# Patient Record
Sex: Male | Born: 1965 | Race: White | Hispanic: No | Marital: Married | Smoking: Former smoker
Health system: Southern US, Community
[De-identification: ages and names within clinical notes are randomized; demographics above are authoritative.]

## PROBLEM LIST (undated history)

## (undated) DIAGNOSIS — F329 Major depressive disorder, single episode, unspecified: Secondary | ICD-10-CM

## (undated) DIAGNOSIS — E7800 Pure hypercholesterolemia, unspecified: Secondary | ICD-10-CM

## (undated) DIAGNOSIS — K219 Gastro-esophageal reflux disease without esophagitis: Secondary | ICD-10-CM

## (undated) DIAGNOSIS — I10 Essential (primary) hypertension: Secondary | ICD-10-CM

## (undated) DIAGNOSIS — F32A Depression, unspecified: Secondary | ICD-10-CM

## (undated) DIAGNOSIS — E785 Hyperlipidemia, unspecified: Secondary | ICD-10-CM

## (undated) DIAGNOSIS — F419 Anxiety disorder, unspecified: Secondary | ICD-10-CM

## (undated) HISTORY — PX: EYE SURGERY: SHX253

## (undated) HISTORY — PX: HERNIA REPAIR: SHX51

---

## 2003-09-02 ENCOUNTER — Inpatient Hospital Stay (HOSPITAL_COMMUNITY): Admission: EM | Admit: 2003-09-02 | Discharge: 2003-09-14 | Payer: Self-pay | Admitting: Psychiatry

## 2003-10-08 ENCOUNTER — Emergency Department (HOSPITAL_COMMUNITY): Admission: EM | Admit: 2003-10-08 | Discharge: 2003-10-08 | Payer: Self-pay | Admitting: Emergency Medicine

## 2006-04-22 ENCOUNTER — Encounter: Admission: RE | Admit: 2006-04-22 | Discharge: 2006-07-21 | Payer: Self-pay | Admitting: Family Medicine

## 2006-09-20 ENCOUNTER — Emergency Department (HOSPITAL_COMMUNITY): Admission: EM | Admit: 2006-09-20 | Discharge: 2006-09-20 | Payer: Self-pay | Admitting: Emergency Medicine

## 2006-10-17 ENCOUNTER — Ambulatory Visit (HOSPITAL_COMMUNITY): Admission: RE | Admit: 2006-10-17 | Discharge: 2006-10-17 | Payer: Self-pay | Admitting: Otolaryngology

## 2006-10-22 ENCOUNTER — Ambulatory Visit (HOSPITAL_COMMUNITY): Admission: RE | Admit: 2006-10-22 | Discharge: 2006-10-23 | Payer: Self-pay | Admitting: Otolaryngology

## 2009-01-02 ENCOUNTER — Emergency Department (HOSPITAL_COMMUNITY): Admission: EM | Admit: 2009-01-02 | Discharge: 2009-01-03 | Payer: Self-pay | Admitting: Emergency Medicine

## 2009-01-03 ENCOUNTER — Inpatient Hospital Stay (HOSPITAL_COMMUNITY): Admission: RE | Admit: 2009-01-03 | Discharge: 2009-01-09 | Payer: Self-pay | Admitting: *Deleted

## 2009-01-03 ENCOUNTER — Ambulatory Visit: Payer: Self-pay | Admitting: *Deleted

## 2009-01-06 ENCOUNTER — Emergency Department (HOSPITAL_COMMUNITY): Admission: EM | Admit: 2009-01-06 | Discharge: 2009-01-07 | Payer: Self-pay | Admitting: Emergency Medicine

## 2010-12-26 LAB — URINALYSIS, ROUTINE W REFLEX MICROSCOPIC
Glucose, UA: NEGATIVE mg/dL
Ketones, ur: NEGATIVE mg/dL
Nitrite: NEGATIVE
Protein, ur: NEGATIVE mg/dL
Urobilinogen, UA: 1 mg/dL (ref 0.0–1.0)

## 2010-12-26 LAB — LIPASE, BLOOD: Lipase: 22 U/L (ref 11–59)

## 2010-12-26 LAB — CBC
HCT: 43.9 % (ref 39.0–52.0)
HCT: 47.4 % (ref 39.0–52.0)
Hemoglobin: 16.3 g/dL (ref 13.0–17.0)
MCHC: 34.3 g/dL (ref 30.0–36.0)
MCHC: 34.3 g/dL (ref 30.0–36.0)
MCV: 85.9 fL (ref 78.0–100.0)
Platelets: 251 10*3/uL (ref 150–400)
RBC: 5.44 MIL/uL (ref 4.22–5.81)
RDW: 11.9 % (ref 11.5–15.5)

## 2010-12-26 LAB — COMPREHENSIVE METABOLIC PANEL
ALT: 76 U/L — ABNORMAL HIGH (ref 0–53)
Alkaline Phosphatase: 73 U/L (ref 39–117)
CO2: 21 mEq/L (ref 19–32)
Calcium: 10 mg/dL (ref 8.4–10.5)
GFR calc non Af Amer: >60 mL/min (ref >60)
Glucose, Bld: 114 mg/dL — ABNORMAL HIGH (ref 70–99)
Potassium: 4 mEq/L (ref 3.5–5.1)
Sodium: 134 mEq/L — ABNORMAL LOW (ref 135–145)

## 2010-12-26 LAB — RAPID URINE DRUG SCREEN, HOSP PERFORMED: Benzodiazepines: NOT DETECTED

## 2010-12-26 LAB — POCT I-STAT, CHEM 8
Chloride: 107 mEq/L (ref 96–112)
Creatinine, Ser: 1.1 mg/dL (ref 0.4–1.5)
Hemoglobin: 15 g/dL (ref 13.0–17.0)
Potassium: 3.9 mEq/L (ref 3.5–5.1)
Sodium: 139 mEq/L (ref 135–145)

## 2010-12-26 LAB — DIFFERENTIAL
Basophils Absolute: 0.1 10*3/uL (ref 0.0–0.1)
Basophils Relative: 0 % (ref 0–1)
Basophils Relative: 1 % (ref 0–1)
Eosinophils Absolute: 0.1 10*3/uL (ref 0.0–0.7)
Eosinophils Absolute: 0.1 10*3/uL (ref 0.0–0.7)
Eosinophils Relative: 2 % (ref 0–5)
Neutrophils Relative %: 64 % (ref 43–77)
Neutrophils Relative %: 77 % (ref 43–77)

## 2011-01-29 NOTE — Discharge Summary (Signed)
Steven Blake, Steven Blake NO.:  [id number]   MEDICAL RECORD NO.:  [id number]          PATIENT TYPE:  IPS   LOCATION:  0305                          FACILITY:  [facility]   PHYSICIAN:  Jasmine Pang, M.D. DATE OF BIRTH:  [birth year]   DATE OF ADMISSION:  01/03/2009  DATE OF DISCHARGE:  01/09/2009                               DISCHARGE SUMMARY   IDENTIFICATION:  This is a 45 year old single white male from  [place], [place] who was admitted on a voluntary basis on  January 03, 2009.   HISTORY OF PRESENT ILLNESS:  The patient was suicidal with no plan.  He  has a history of suicide attempt by overdose.  He states he has been  depressed for a long time.  He could not take it when his girlfriend  put him out.  He states he has been hearing vague voices earlier, but  stated that this has not been bad.  For further admission information,  see psychiatric admission assessment.   PHYSICAL FINDINGS:  Physical exam was done in the [facility] ED prior  to admission.  There were no acute physical or medical problems noted.   ADMISSION LABORATORIES:  Alcohol level was less than 5.  BMET was within  normal limits.  Hemoglobin was 15 and hematocrit 44.  CBC was within  normal limits.  Urinalysis was negative.  UDS was negative.   HOSPITAL COURSE:  Upon admission, the patient was restarted on Seroquel  300 mg p.o. q.h.s.  He was also placed on Protonix 40 mg a day, Prozac  20 mg daily.  He was also started on Motrin 600 mg q.6 h. p.r.n. pain  and Imodium for diarrhea and simethicone for gas and Bentyl 10 mg q.i.d.  p.r.n. abdominal cramping due to GI distress.  In individual sessions  with me, the patient stated he has no means of transportation to home.  He states he became suicidal.  He also complains of weird social  anxiety.  He also has chronic pain on the right side of his face from  some eyelashes going backward.  He states he first go to the [facility], but he  is not compliant recently.  He was in [facility] several months ago on probation for possession of  paraphernalia.  The patient was focused on wanting to go to the [facility] when he left here.  He continued to be depressed, anxious, and  hopeless as hospitalization progressed.  There was positive suicidal  ideation.  He discussed having no financial means whatsoever.  His sleep  was good and appetite was good.  On January 06, 2009, mood was less  depressed and less anxious.  He discussed wanting to get straight now  for his children.  He continued to the plan to go to the [facility]  post discharge.  He was continuing to complain of severe abdominal pain.  He was sent to [facility] ED for evaluation revealed that he  was suffering from severe gas pain.  He was started on  Donnatal p.o. q.2  hours p.r.n.  On January 07, 2009, he was continuing to feel less  depressed, less anxious.  He stated he wanted to call his children's  mother, but was not sure how she will reacts since they have been having  difficulties.  As hospitalization progressed, sleep was good.  He  anticipated discharge date on January 09, 2009.  On January 09, 2009, his  sleep was good.  Appetite was fair.  Mood was less depressed and less  anxious.  Affect was consistent with mood.  There was no suicidal or  homicidal ideation.  No thoughts of self-injurious behavior.  No  auditory or visual hallucinations.  No paranoia or delusions.  Thoughts  were logical and goal-directed.  Thought content, no predominant theme.  Cognitive was grossly intact.  Insight good.  Judgment good.  Impulse  control good.  He was felt to be safe for discharge today who planned to  go to the [facility] for further evaluation of his medical problems.   DISCHARGE DIAGNOSES:  Axis I:  Mood disorder, not otherwise specified.  Axis II:  None.  Axis III:  Chronic back pain, osteoarthritis.  Axis IV:  Severe  (problems with primary support group, occupational  problem, housing problem, medical problems).  Axis V:  Global assessment of functioning was 50 upon discharge.  GAF  was 35 upon admission.  GAF highest past year was 60-65.   DISCHARGE PLAN:  There was no specific activity level or dietary  restrictions.   POSTHOSPITAL CARE PLANS:  The patient will go to the [facility] on January 10, 2009 at 9:30 p.m.   DISCHARGE MEDICATIONS:  1. Prozac 20 mg same.  2. Protonix 40 mg in the a.m.  3. Seroquel XR 300 mg at bedtime.      Jasmine Pang, M.D.  Electronically Signed     BHS/MEDQ  D:  01/09/2009  T:  01/10/2009  Job:  [id number]

## 2011-02-01 NOTE — Op Note (Signed)
NAMETAESHAWN, HELFMAN               ACCOUNT NO.:  [id number]   MEDICAL RECORD NO.:  [id number]          PATIENT TYPE:  OIB   LOCATION:  5743                         FACILITY:  [facility]   PHYSICIAN:  Lucky Cowboy, MD         DATE OF BIRTH:  [birth year]   DATE OF PROCEDURE:  10/22/2006  DATE OF DISCHARGE:  10/23/2006                               OPERATIVE REPORT   PREOPERATIVE DIAGNOSIS:  Orbital floor fracture.   POSTOPERATIVE DIAGNOSIS:  Orbital floor fracture.   PROCEDURE:  Open reduction and internal fixation, right orbital floor  fracture.   SURGEON:  Lucky Cowboy, M.D.   ASSISTANT SURGEON:  Karren Burly D. Jenne Pane, M.D.   ANESTHESIA:  General endotracheal anesthesia.   ESTIMATED BLOOD LOSS:  Less than 30 mL.   SPECIMEN:  None.   COMPLICATIONS:  None.   INDICATIONS:  The patient is a 45 year old male who sustained a right  orbital floor fracture.  This has been demonstrated with prolapse of fat  into the right maxillary sinus.  Due to the severity of the fracture as  well as entrapment, open reduction and internal fixation with possible  Caldwell-Luc exposure is planned.   FINDINGS:  The patient was noted to have a very large floor defect on  the right.  This was associated with orbital fat prolapsing into the  maxillary sinus.   DESCRIPTION OF PROCEDURE:  The patient was taken to the operating room  and placed on the table in the supine position.  He was then placed  under general endotracheal anesthesia and the table rotated counter  clockwise 90 degrees.  The eye was prepped with Betadine and draped in  the usual sterile fashion.  1% lidocaine with 1:100,000 was then used to  inject the right lateral canthus as well as the right inferior eyelid.  After allowing time for vasoconstrictive effect, a #15 blade was used to  incise the right lateral canthus.  This was performed and the incision  carried laterally for approximately 6 mm.  This incision was made  through the overlying  skin and underlying muscle and fat was divided  using Bovie cautery.  The lateral canthal ligament was then identified  and cut using scissors.   At this point, the subciliary mucosa was then dissected free from the  underlying orbital fat.  The mucosa was then divided carefully to within  about 5-6 mm of the inferior puncta.  Care was then used with the [facility]  elevator to elevate the periorbita off of the underlying bone and the  fracture was exposed.  Great care was used to dissect the orbital fat  out of the maxillary sinus.  It was next to impossible to free the past  from the inferior orbital nerve.  The nerve appeared to be exquisitely  contused.  A solvable mesh plate with 7 x 7 holes measuring 1.5 mm in  thickness was then placed after contouring it in a cone shaped fashion  to fit the contour of the orbital floor.  The mucosa was then placed  back into its original position.  The lateral tendon was reapproximated to the periosteum in a simple  interrupted fashion which was buried using 4-0 Monocryl.  The  subcutaneous tissues were reapproximated in a simple interrupted buried  stitch using 4-0 Vicryl.  The skin was closed in a simple interrupted  stitch using 6-0 Prolene.  Erythromycin ointment was applied.  The table  was rotated clockwise 90 degrees to its original position.  The patient  was awakened from anesthesia and taken to the post anesthesia care unit  in stable condition.  There were no complications.      Lucky Cowboy, MD  Electronically Signed     SJ/MEDQ  D:  01/22/2007  T:  01/22/2007  Job:  [id number]
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
## 2014-09-03 ENCOUNTER — Emergency Department (HOSPITAL_COMMUNITY)
Admission: EM | Admit: 2014-09-03 | Discharge: 2014-09-03 | Disposition: A | Discharge status: Home / Self Care | Payer: Non-veteran care | Attending: Emergency Medicine | Admitting: Emergency Medicine

## 2014-09-03 ENCOUNTER — Emergency Department (HOSPITAL_COMMUNITY): Payer: Non-veteran care

## 2014-09-03 ENCOUNTER — Encounter (HOSPITAL_COMMUNITY): Payer: Self-pay | Admitting: Emergency Medicine

## 2014-09-03 DIAGNOSIS — I10 Essential (primary) hypertension: Secondary | ICD-10-CM | POA: Insufficient documentation

## 2014-09-03 DIAGNOSIS — Z87891 Personal history of nicotine dependence: Secondary | ICD-10-CM | POA: Diagnosis not present

## 2014-09-03 DIAGNOSIS — Z8639 Personal history of other endocrine, nutritional and metabolic disease: Secondary | ICD-10-CM | POA: Diagnosis not present

## 2014-09-03 DIAGNOSIS — F419 Anxiety disorder, unspecified: Secondary | ICD-10-CM | POA: Diagnosis not present

## 2014-09-03 DIAGNOSIS — R079 Chest pain, unspecified: Secondary | ICD-10-CM | POA: Diagnosis not present

## 2014-09-03 DIAGNOSIS — Z8719 Personal history of other diseases of the digestive system: Secondary | ICD-10-CM | POA: Insufficient documentation

## 2014-09-03 HISTORY — DX: Depression, unspecified: F32.A

## 2014-09-03 HISTORY — DX: Major depressive disorder, single episode, unspecified: F32.9

## 2014-09-03 HISTORY — DX: Gastro-esophageal reflux disease without esophagitis: K21.9

## 2014-09-03 HISTORY — DX: Pure hypercholesterolemia, unspecified: E78.00

## 2014-09-03 HISTORY — DX: Hyperlipidemia, unspecified: E78.5

## 2014-09-03 HISTORY — DX: Anxiety disorder, unspecified: F41.9

## 2014-09-03 HISTORY — DX: Essential (primary) hypertension: I10

## 2014-09-03 LAB — CBC
HEMATOCRIT: 41.1 % (ref 39.0–52.0)
HEMOGLOBIN: 14.1 g/dL (ref 13.0–17.0)
MCH: 28.7 pg (ref 26.0–34.0)
MCHC: 34.3 g/dL (ref 30.0–36.0)
MCV: 83.5 fL (ref 78.0–100.0)
Platelets: 261 10*3/uL (ref 150–400)
RBC: 4.92 MIL/uL (ref 4.22–5.81)
RDW: 13.1 % (ref 11.5–15.5)
WBC: 9 10*3/uL (ref 4.0–10.5)

## 2014-09-03 LAB — BASIC METABOLIC PANEL
Anion gap: 12 (ref 5–15)
BUN: 13 mg/dL (ref 6–23)
CALCIUM: 9.6 mg/dL (ref 8.4–10.5)
CO2: 24 meq/L (ref 19–32)
Chloride: 104 mEq/L (ref 96–112)
Creatinine, Ser: 0.89 mg/dL (ref 0.50–1.35)
GFR calc Af Amer: >90 mL/min (ref >90)
GLUCOSE: 98 mg/dL (ref 70–99)
Potassium: 4.3 mEq/L (ref 3.7–5.3)
SODIUM: 140 meq/L (ref 137–147)

## 2014-09-03 LAB — I-STAT TROPONIN, ED: TROPONIN I, POC: 0 ng/mL (ref 0.00–0.08)

## 2014-09-03 NOTE — ED Notes (Signed)
Pt comfortable with discharge and follow up instructions. Pt declines wheelchair, escorted to waiting area by this RN. No prescriptions. 

## 2014-09-03 NOTE — ED Provider Notes (Signed)
CSN: [id number]     Arrival date & time 09/03/14  6160 History   First MD Initiated Contact with Patient 09/03/14 [phone]     Chief Complaint  Patient presents with  . Chest Pain     (Consider location/radiation/quality/duration/timing/severity/associated sxs/prior Treatment) HPI Comments: Patient is a 48 year old male with a past medical history of GERD, hypertension, hyperlipidemia, and depression/anxiety who presents with a 1 week history of chest pain. Patient reports the chest pain has been intermittent without known trigger. The pain will start and stop spontaneously. The pain "is different every time. Patient reports sometimes the pain has been sharp, dull, pins and needles, and aching. Sometimes the pain will be in his left chest and radiate to the left shoulder and other times the pain will be in the right chest and radiate to his right shoulder. He denies any associated symptoms. He does report a history of anxiety which he thinks may be causing his chest pain. Patient saw his PCP one week ago and was instructed to come to the ED because he was not able to get back to the [place] for another appointment. No aggravating/alleviating factors. Last episode occurred prior to arrival.   Past Medical History  Diagnosis Date  . GERD (gastroesophageal reflux disease)   . Hypertension   . Hyperlipidemia   . Depression   . Anxiety   . Hypercholesteremia    Past Surgical History  Procedure Laterality Date  . Hernia repair    . Eye surgery     No family history on file. History  Substance Use Topics  . Smoking status: Former Research scientist (life sciences)  . Smokeless tobacco: Not on file  . Alcohol Use: No    Review of Systems  Constitutional: Negative for fever, chills and fatigue.  HENT: Negative for trouble swallowing.   Eyes: Negative for visual disturbance.  Respiratory: Negative for shortness of breath.   Cardiovascular: Positive for chest pain. Negative for palpitations.  Gastrointestinal: Negative  for nausea, vomiting, abdominal pain and diarrhea.  Genitourinary: Negative for dysuria and difficulty urinating.  Musculoskeletal: Negative for arthralgias and neck pain.  Skin: Negative for color change.  Neurological: Negative for dizziness and weakness.  Psychiatric/Behavioral: Negative for dysphoric mood. The patient is nervous/anxious.       Allergies  Sulfa antibiotics  Home Medications   Prior to Admission medications   Not on File   BP 103/60 mmHg  Pulse 58  Temp(Src) 98.4 F (36.9 C) (Oral)  Resp 16  Ht 5\' 6"  (1.676 m)  Wt 200 lb (90.719 kg)  BMI 32.30 kg/m2  SpO2 100% Physical Exam  Constitutional: He is oriented to person, place, and time. He appears well-developed and well-nourished. No distress.  HENT:  Head: Normocephalic and atraumatic.  Eyes: Conjunctivae and EOM are normal.  Neck: Normal range of motion.  Cardiovascular: Normal rate and regular rhythm.  Exam reveals no gallop and no friction rub.   No murmur heard. Pulmonary/Chest: Effort normal and breath sounds normal. He has no wheezes. He has no rales. He exhibits no tenderness.  Abdominal: Soft. He exhibits no distension. There is no tenderness. There is no rebound.  Musculoskeletal: Normal range of motion.  No lower extremity edema or calf tenderness to palpation.   Neurological: He is alert and oriented to person, place, and time. Coordination normal.  Speech is goal-oriented. Moves limbs without ataxia.   Skin: Skin is warm and dry.  Psychiatric: He has a normal mood and affect. His behavior is normal.  Nursing note and vitals reviewed.   ED Course  Procedures (including critical care time) Labs Review Labs Reviewed  [place], ED    Imaging Review Dg Chest 2 View  09/03/2014   CLINICAL DATA:  Two week history of chest pain. Difficulty breathing  EXAM: CHEST  2 VIEW  COMPARISON:  October 17, 2006  FINDINGS: Lungs are clear. Heart size pulmonary  vascularity are normal. No adenopathy. No pneumothorax. No bone lesions.  IMPRESSION: No edema or consolidation.   Electronically Signed   By: Lowella Grip M.D.   On: 09/03/2014 07:26     EKG Interpretation   Date/Time:  Saturday September 03 2014 06:22:33 EST Ventricular Rate:  55 PR Interval:  163 QRS Duration: 96 QT Interval:  404 QTC Calculation: 386 R Axis:   14 Text Interpretation:  Sinus bradycardia Since last tracing rate slower (17 Oct 2006) Confirmed by [facility]  MD-I, IVA ([id number]) on 09/03/2014 6:45:06 AM      MDM   Final diagnoses:  Chest pain    6:53 AM Labs, troponin, and chest xray pending. Vitals stable and patient afebrile.   8:13 AM Labs, chest xray, and troponin unremarkable for acute changes. Patient's HEART score is 3, making him low risk for major cardiac event. Patient's pain does not sound cardiac in nature. Patient will have delta troponin. If negative, patient will be discharged with outpatient follow up.   9:31 AM Spoke with Dr Audie Pinto about the patient. Delta troponin will not offer anymore information. Patient will be discharged without further workup. Patient instructed to return to the ED with worsening or concerning symptoms.   Alvina Chou, PA-C 09/03/14 1308  Dot Lanes, MD 09/12/14 [phone]

## 2014-09-03 NOTE — ED Notes (Signed)
Pt arrives from home with c/o chest pain that has been going on for a bout a week of two, was scheduled to see primary in [place] yesterday but unable to get a ride, advised to come here to get checked out. Per pt, pain moves from central chest to left, from shoulder and into left underarm. Dull intermittently, occasionally sharp.

## 2014-09-03 NOTE — Discharge Instructions (Signed)
Follow up with your doctor for further evaluation. Return to the ED with worsening or concerning symptoms. Refer to attached documents for more information.

## 2015-11-19 ENCOUNTER — Encounter (HOSPITAL_COMMUNITY): Payer: Self-pay | Admitting: *Deleted

## 2015-11-19 ENCOUNTER — Emergency Department (HOSPITAL_COMMUNITY)
Admission: EM | Admit: 2015-11-19 | Discharge: 2015-11-19 | Disposition: A | Discharge status: Home / Self Care | Payer: Non-veteran care | Attending: Emergency Medicine | Admitting: Emergency Medicine

## 2015-11-19 ENCOUNTER — Emergency Department (HOSPITAL_COMMUNITY): Payer: Non-veteran care

## 2015-11-19 DIAGNOSIS — Z87891 Personal history of nicotine dependence: Secondary | ICD-10-CM | POA: Insufficient documentation

## 2015-11-19 DIAGNOSIS — Z79899 Other long term (current) drug therapy: Secondary | ICD-10-CM | POA: Diagnosis not present

## 2015-11-19 DIAGNOSIS — X58XXXA Exposure to other specified factors, initial encounter: Secondary | ICD-10-CM | POA: Insufficient documentation

## 2015-11-19 DIAGNOSIS — Y998 Other external cause status: Secondary | ICD-10-CM | POA: Insufficient documentation

## 2015-11-19 DIAGNOSIS — E785 Hyperlipidemia, unspecified: Secondary | ICD-10-CM | POA: Diagnosis not present

## 2015-11-19 DIAGNOSIS — S29012A Strain of muscle and tendon of back wall of thorax, initial encounter: Secondary | ICD-10-CM | POA: Insufficient documentation

## 2015-11-19 DIAGNOSIS — E7800 Pure hypercholesterolemia, unspecified: Secondary | ICD-10-CM | POA: Insufficient documentation

## 2015-11-19 DIAGNOSIS — I10 Essential (primary) hypertension: Secondary | ICD-10-CM | POA: Insufficient documentation

## 2015-11-19 DIAGNOSIS — Y9389 Activity, other specified: Secondary | ICD-10-CM | POA: Diagnosis not present

## 2015-11-19 DIAGNOSIS — F419 Anxiety disorder, unspecified: Secondary | ICD-10-CM | POA: Insufficient documentation

## 2015-11-19 DIAGNOSIS — F329 Major depressive disorder, single episode, unspecified: Secondary | ICD-10-CM | POA: Insufficient documentation

## 2015-11-19 DIAGNOSIS — Y9289 Other specified places as the place of occurrence of the external cause: Secondary | ICD-10-CM | POA: Diagnosis not present

## 2015-11-19 DIAGNOSIS — M546 Pain in thoracic spine: Secondary | ICD-10-CM | POA: Diagnosis present

## 2015-11-19 DIAGNOSIS — S29019A Strain of muscle and tendon of unspecified wall of thorax, initial encounter: Secondary | ICD-10-CM

## 2015-11-19 DIAGNOSIS — K219 Gastro-esophageal reflux disease without esophagitis: Secondary | ICD-10-CM | POA: Insufficient documentation

## 2015-11-19 MED ORDER — TRAMADOL HCL 50 MG PO TABS: 50.0000 mg | ORAL_TABLET | Freq: Four times a day (QID) | ORAL | Status: DC | PRN

## 2015-11-19 MED ORDER — KETOROLAC TROMETHAMINE 60 MG/2ML IM SOLN
60.0000 mg | Freq: Once | INTRAMUSCULAR | Status: AC
  Administered 2015-11-19: 60 mg via INTRAMUSCULAR
  Filled 2015-11-19: qty 2

## 2015-11-19 MED ORDER — CYCLOBENZAPRINE HCL 10 MG PO TABS: 10.0000 mg | ORAL_TABLET | Freq: Three times a day (TID) | ORAL | Status: DC | PRN

## 2015-11-19 MED ORDER — PREDNISONE 50 MG PO TABS: 50.0000 mg | ORAL_TABLET | Freq: Every day | ORAL | Status: DC

## 2015-11-19 NOTE — ED Notes (Signed)
Pt reports backj pain started this AM with out injury.

## 2015-11-19 NOTE — Discharge Instructions (Signed)
Return here as needed and most likely strained her thoracic musculature.  Follow-up with her primary care doctor.  Use ice and heat on the area that is sore

## 2015-11-19 NOTE — ED Notes (Signed)
Declined W/C at D/C and was escorted to lobby by RN. 

## 2015-11-19 NOTE — ED Provider Notes (Signed)
CSN: [place]:[id number]     Arrival date & time 11/19/15  1129 History  By signing my name below, I, Steven Blake, attest that this documentation has been prepared under the direction and in the presence of [employer], PA-C. Electronically Signed: Julien Blake, ED Scribe. 11/19/2015. 11:45 AM.    Chief Complaint  Patient presents with  . Back Pain       The history is provided by the patient. No language interpreter was used.   HPI Comments: Steven Blake is a 50 y.o. male who has a hx of GERD, HTN, hyperlipidemia, depression, anxiety, and hypercholesteremia presents to the Emergency Department complaining of constant, gradual worsening, aching thoracic back pain that radiates to his right arm onset this morning. He endorses increased pain when taking a deep breath. He has a hx of intermittent lower back pain in the past but reports this pain is completely different from the others. Pt has not taken any medication to alleviate his pain. He denies injury or fall.  Past Medical History  Diagnosis Date  . GERD (gastroesophageal reflux disease)   . Hypertension   . Hyperlipidemia   . Depression   . Anxiety   . Hypercholesteremia    Past Surgical History  Procedure Laterality Date  . Hernia repair    . Eye surgery     No family history on file. Social History  Substance Use Topics  . Smoking status: Former Research scientist (life sciences)  . Smokeless tobacco: Not on file  . Alcohol Use: No    Review of Systems  A complete 10 system review of systems was obtained and all systems are negative except as noted in the HPI and PMH.    Allergies  Niacin and related and Sulfa antibiotics  Home Medications   Prior to Admission medications   Medication Sig Start Date End Date Taking? Authorizing Provider  atorvastatin (LIPITOR) 40 MG tablet Take 40 mg by mouth daily.    Historical Provider, MD  gabapentin (NEURONTIN) 300 MG capsule Take 300 mg by mouth 3 (three) times daily.    Historical Provider, MD   hydrochlorothiazide (HYDRODIURIL) 25 MG tablet Take 25 mg by mouth daily.    Historical Provider, MD  metoprolol (LOPRESSOR) 50 MG tablet Take 50 mg by mouth 2 (two) times daily.    Historical Provider, MD  omeprazole (PRILOSEC) 20 MG capsule Take 20 mg by mouth daily.    Historical Provider, MD   Triage vitals: BP 149/75 mmHg  Pulse 57  Temp(Src) 97.8 F (36.6 C) (Oral)  Resp 16  SpO2 95% Physical Exam  Constitutional: He is oriented to person, place, and time. He appears well-developed and well-nourished.  HENT:  Head: Normocephalic and atraumatic.  Eyes: EOM are normal.  Neck: Normal range of motion.  Cardiovascular: Normal rate, regular rhythm, normal heart sounds and intact distal pulses.   Pulmonary/Chest: Effort normal and breath sounds normal. No respiratory distress.  Abdominal: Soft. He exhibits no distension. There is no tenderness.  Musculoskeletal: Normal range of motion.  Mid right sided thoracic pain medial to scapula  Neurological: He is alert and oriented to person, place, and time.  Skin: Skin is warm and dry.  Psychiatric: He has a normal mood and affect. Judgment normal.  Nursing note and vitals reviewed.   ED Course  Procedures  DIAGNOSTIC STUDIES: Oxygen Saturation is 95% on RA, adequate by my interpretation.  COORDINATION OF CARE:  11:44 AM Will order x-ray of back. Discussed treatment plan with pt at bedside and  pt agreed to plan.  Labs Review Labs Reviewed - No data to display  Imaging Review Dg Chest 2 View  11/19/2015  CLINICAL DATA:  Mid thoracic back pain radiating to RIGHT-side of chest for 2 days worse this morning, dyspnea, hypertension, former smoker, hyperlipidemia EXAM: CHEST  2 VIEW COMPARISON:  09/03/2014 FINDINGS: Upper normal heart size. Mediastinal contours and pulmonary vascularity normal. Lungs clear. No pleural effusion or pneumothorax. Bones unremarkable. IMPRESSION: No acute abnormalities. Electronically Signed   By: Lavonia Dana  M.D.   On: 11/19/2015 12:24   I have personally reviewed and evaluated these images and lab results as part of my medical decision-making.  Patient retreated for thoracic strain.  He has no risk factors for PE.  He is not hypoxic and not tachycardic.  He is in no acute distress  Dalia Heading, PA-C 11/19/15 1234  Quintella Reichert, MD 11/20/15 [phone]

## 2018-06-08 ENCOUNTER — Encounter: Payer: Self-pay | Admitting: Gastroenterology

## 2018-07-14 ENCOUNTER — Encounter: Payer: Self-pay | Admitting: Gastroenterology

## 2018-07-14 ENCOUNTER — Ambulatory Visit (INDEPENDENT_AMBULATORY_CARE_PROVIDER_SITE_OTHER): Payer: No Typology Code available for payment source | Admitting: Gastroenterology

## 2018-07-14 ENCOUNTER — Encounter (INDEPENDENT_AMBULATORY_CARE_PROVIDER_SITE_OTHER): Payer: Self-pay

## 2018-07-14 VITALS — BP 120/86 | HR 60 | Ht 66.0 in | Wt 182.8 lb

## 2018-07-14 DIAGNOSIS — K625 Hemorrhage of anus and rectum: Secondary | ICD-10-CM | POA: Diagnosis not present

## 2018-07-14 DIAGNOSIS — K219 Gastro-esophageal reflux disease without esophagitis: Secondary | ICD-10-CM | POA: Diagnosis not present

## 2018-07-14 DIAGNOSIS — R1013 Epigastric pain: Secondary | ICD-10-CM | POA: Diagnosis not present

## 2018-07-14 DIAGNOSIS — Z1211 Encounter for screening for malignant neoplasm of colon: Secondary | ICD-10-CM

## 2018-07-14 MED ORDER — NA SULFATE-K SULFATE-MG SULF 17.5-3.13-1.6 GM/177ML PO SOLN: 1.0000 | ORAL | 0 refills | Status: DC

## 2018-07-14 NOTE — Patient Instructions (Addendum)
  There are changes that you can make that may help your reflux. Please minimize caffeine, alcohol, chocolate, peppermints, carbonated beverages, Consume acid and spicy foods only in moderation. Eat smaller meals and avoid eating for several hours before exercise or sleep. Work to maintain a health weight.   Talk to your doctor about alternatives to Aleve to manage your back pain. Aleve may be causing your abdominal pain and bloating.   Increase Omeprazole to 40 mg daily.   You have been scheduled for an endoscopy and colonoscopy. Please follow the written instructions given to you at your visit today. Please pick up your prep supplies at the pharmacy within the next 1-3 days. If you use inhalers (even only as needed), please bring them with you on the day of your procedure. Your physician has requested that you go to www.startemmi.com and enter the access code given to you at your visit today. This web site gives a general overview about your procedure. However, you should still follow specific instructions given to you by our office regarding your preparation for the procedure.

## 2018-07-14 NOTE — Progress Notes (Signed)
Referring Provider: No ref. provider found Primary Care Physician:  No primary care provider on file.   Reason for Consultation:  Need for screening colonoscopy  IMPRESSION:  Epigastric abdominal pain with associated bloating GERD Intermittent bright red blood per rectum x many years Need for colon cancer screening Occasional NSAIDs for back pain (Aleve)  Upper GI complaints are likely esophagitis, reflux, H pylori, gastritis, or NSAID-related peptic ulcer disease. Symptoms persist despite daily omeprazole. I am recommending an EGD given this differential.  He is due colonoscopy for colon cancer screening.   PLAN: Increase omeprazole to 40 mg daily EGD Colonoscopy Lifestyle recommendations: minimize caffeine, alcohol, chocolate, peppermints, carbonated beverages, and nicotine, and consuming acid and spicy foods only in moderation. Eat smaller meals and avoid eating for several hours before exercise or sleep. Work to maintain a health weight.    I consented the patient at the bedside today discussing the risks, benefits, and alternatives to endoscopic evaluation. In particular, we discussed the risks that include, but are not limited to, reaction to medication, cardiopulmonary compromise, bleeding requiring blood transfusion, aspiration resulting in pneumonia, perforation requiring surgery, and even death. We reviewed the risk of missed lesion including polyps or even cancer. The patient acknowledges these risks and asks that we proceed.   HPI: Steven Blake is a 52 y.o. male seen on referral from the [place].  Records from the [place] note a diagnosis of social anxiety disorder, depression, panic disorder with agoraphobia, and bipolar 2 disorder. He has a history of street drug addiction.  Off marijuana since May 2019. The referral notes the patient will likely benefit from MAC sedation.  Intermittent, non-radiating upper abdominal pain described as a sharp stabbing pain, fluctuating bloating  and acid reflux 5-10 years. Symptoms have been present for so long he is unable to remember more specific details because he has to just move on. Taking omeprazole 20 mg daily with control of the GERD. Occasional blood on the toilet paper - 5-10 times over the last 10 years.  No dysphasia, odynophagia, nausea, brash, cough, dysphonia.  No melena, hematochezia. Weight is stable.  Appetite is good.  No other associated symptoms. No identified exacerbating or relieving features.   Occasional Aleve for back pain. New job working at [employer] over the last 2 months requiring Aleve on 4 occassions. He has also used hydrocodones over the last month for the back pain.    No prior colon cancer screening. No known family history of colon cancer or polyps.    Past Medical History:  Diagnosis Date  . Anxiety   . Depression   . GERD (gastroesophageal reflux disease)   . Hypercholesteremia   . Hyperlipidemia   . Hypertension     Past Surgical History:  Procedure Laterality Date  . EYE SURGERY    . HERNIA REPAIR      Prior to Admission medications   Medication Sig Start Date End Date Taking? Authorizing Provider  atorvastatin (LIPITOR) 40 MG tablet Take 40 mg by mouth daily.    [provider]  cyclobenzaprine (FLEXERIL) 10 MG tablet Take 1 tablet (10 mg total) by mouth 3 (three) times daily as needed for muscle spasms. 11/19/15   Lawyer, Harrell Gave, PA-C  gabapentin (NEURONTIN) 300 MG capsule Take 300 mg by mouth 3 (three) times daily.    [provider]  hydrochlorothiazide (HYDRODIURIL) 25 MG tablet Take 25 mg by mouth daily.    [provider]  metoprolol (LOPRESSOR) 50 MG tablet Take 50  mg by mouth 2 (two) times daily.    [provider]  omeprazole (PRILOSEC) 20 MG capsule Take 20 mg by mouth daily.    [provider]  predniSONE (DELTASONE) 50 MG tablet Take 1 tablet (50 mg total) by mouth daily. 11/19/15   Lawyer, Harrell Gave, PA-C  traMADol (ULTRAM)  50 MG tablet Take 1 tablet (50 mg total) by mouth every 6 (six) hours as needed. 11/19/15   Dalia Heading, PA-C    Current Outpatient Medications  Medication Sig Dispense Refill  . atorvastatin (LIPITOR) 40 MG tablet Take 40 mg by mouth daily.    Marland Kitchen omeprazole (PRILOSEC) 20 MG capsule Take 20 mg by mouth daily.     No current facility-administered medications for this visit.     Allergies as of 07/14/2018 - Review Complete 07/14/2018  Allergen Reaction Noted  . Niacin and related Other (See Comments) 09/03/2014  . Sulfa antibiotics Nausea And Vomiting 09/03/2014    Family History  Problem Relation Age of Onset  . Lymphoma Father   . Diabetes Maternal Grandmother     Social History   Socioeconomic History  . Marital status: Married    Spouse name: Not on file  . Number of children: 2  . Years of education: Not on file  . Highest education level: Not on file  Occupational History  . Not on file  Social Needs  . Financial resource strain: Not on file  . Food insecurity:    Worry: Not on file    Inability: Not on file  . Transportation needs:    Medical: Not on file    Non-medical: Not on file  Tobacco Use  . Smoking status: Former Research scientist (life sciences)  . Smokeless tobacco: Former Network engineer and Sexual Activity  . Alcohol use: No  . Drug use: No    Comment: former crack cocaine  . Sexual activity: Not on file  Lifestyle  . Physical activity:    Days per week: Not on file    Minutes per session: Not on file  . Stress: Not on file  Relationships  . Social connections:    Talks on phone: Not on file    Gets together: Not on file    Attends religious service: Not on file    Active member of club or organization: Not on file    Attends meetings of clubs or organizations: Not on file    Relationship status: Not on file  . Intimate partner violence:    Fear of current or ex partner: Not on file    Emotionally abused: Not on file    Physically abused: Not on file     Forced sexual activity: Not on file  Other Topics Concern  . Not on file  Social History Narrative  . Not on file    Review of Systems: 12 system ROS is negative except as noted above.   Physical Exam: Vital signs were reviewed. General:   Alert, well-nourished, pleasant and cooperative in NAD Head:  Normocephalic and atraumatic. Eyes:  Sclera clear, no icterus.   Conjunctiva pink. Mouth:  No deformity or lesions.   Neck:  Supple; no thyromegaly. Lungs:  Clear throughout to auscultation.   No wheezes.  Heart:  Regular rate and rhythm; no murmurs Abdomen:  Soft, mild central obesity, nontender, normal bowel sounds. No rebound or guarding. No hepatosplenomegaly Rectal:  Deferred  Msk:  Symmetrical without gross deformities. Extremities:  No gross deformities or edema. Neurologic:  Alert and  oriented x4;  grossly nonfocal Skin:  No rash or bruise. Multiple tattoos.  Psych:  Alert and cooperative. Normal mood and affect.   Kelseigh Diver L. Tarri Glenn Md, MPH [facility] 07/14/2018, 2:04 PM

## 2018-07-15 ENCOUNTER — Telehealth: Payer: Self-pay | Admitting: Gastroenterology

## 2018-07-15 NOTE — Telephone Encounter (Signed)
Per Maggie at the [place], they do not carry Suprep- I have a sample Suprep I am going to give pt- Called Mr Mcnulty, he will pick up the prep sample by next week per pt.  Lot [id number]  Exp 8/21  As directed- Instructed pt to pick up 3rd floor   [facility]

## 2018-07-16 ENCOUNTER — Other Ambulatory Visit: Payer: Self-pay

## 2018-07-16 MED ORDER — NA SULFATE-K SULFATE-MG SULF 17.5-3.13-1.6 GM/177ML PO SOLN: 1.0000 | ORAL | 0 refills | Status: DC

## 2018-07-20 ENCOUNTER — Other Ambulatory Visit: Payer: Self-pay

## 2018-07-20 ENCOUNTER — Telehealth: Payer: Self-pay | Admitting: Gastroenterology

## 2018-07-20 MED ORDER — NA SULFATE-K SULFATE-MG SULF 17.5-3.13-1.6 GM/177ML PO SOLN: 1.0000 | ORAL | 0 refills | Status: DC

## 2018-07-20 NOTE — Telephone Encounter (Signed)
Steven Blake at the [facility] calling to inform that they do not have suprep only golytely. She wants to know if it is ok to give pt that prep.

## 2018-07-21 NOTE — Telephone Encounter (Signed)
Spoke to patient to let him know I would leave a Suprep sample up front for him but he had already been told this by Lelan Pons, who had already left a sample up front.  Patient will try to come tomorrow to pick this up

## 2018-08-18 ENCOUNTER — Encounter: Payer: Self-pay | Admitting: Gastroenterology

## 2018-08-18 ENCOUNTER — Ambulatory Visit (AMBULATORY_SURGERY_CENTER): Payer: No Typology Code available for payment source | Admitting: Gastroenterology

## 2018-08-18 VITALS — BP 130/77 | HR 59 | Temp 98.9°F | Resp 12 | Ht 66.0 in | Wt 182.0 lb

## 2018-08-18 DIAGNOSIS — K219 Gastro-esophageal reflux disease without esophagitis: Secondary | ICD-10-CM

## 2018-08-18 DIAGNOSIS — K2980 Duodenitis without bleeding: Secondary | ICD-10-CM

## 2018-08-18 DIAGNOSIS — K2970 Gastritis, unspecified, without bleeding: Secondary | ICD-10-CM | POA: Diagnosis not present

## 2018-08-18 DIAGNOSIS — D125 Benign neoplasm of sigmoid colon: Secondary | ICD-10-CM

## 2018-08-18 DIAGNOSIS — Z1211 Encounter for screening for malignant neoplasm of colon: Secondary | ICD-10-CM

## 2018-08-18 DIAGNOSIS — K635 Polyp of colon: Secondary | ICD-10-CM

## 2018-08-18 MED ORDER — SODIUM CHLORIDE 0.9 % IV SOLN: 500.0000 mL | Freq: Once | INTRAVENOUS | Status: DC

## 2018-08-18 NOTE — Progress Notes (Signed)
Called to room to assist during endoscopic procedure.  Patient ID and intended procedure confirmed with present staff. Received instructions for my participation in the procedure from the performing physician.  

## 2018-08-18 NOTE — Progress Notes (Signed)
Report given to PACU, vss 

## 2018-08-18 NOTE — Patient Instructions (Signed)
Please read handouts provided. Avoid all NSAIDS. Await pathology results. Continue Omeprazole 40 mg daily. Continue present medications. High Fiber diet recommended. Return to GI office in 2 weeks.      YOU HAD AN ENDOSCOPIC PROCEDURE TODAY AT [facility]:   Refer to the procedure report that was given to you for any specific questions about what was found during the examination.  If the procedure report does not answer your questions, please call your gastroenterologist to clarify.  If you requested that your care partner not be given the details of your procedure findings, then the procedure report has been included in a sealed envelope for you to review at your convenience later.  YOU SHOULD EXPECT: Some feelings of bloating in the abdomen. Passage of more gas than usual.  Walking can help get rid of the air that was put into your GI tract during the procedure and reduce the bloating. If you had a lower endoscopy (such as a colonoscopy or flexible sigmoidoscopy) you may notice spotting of blood in your stool or on the toilet paper. If you underwent a bowel prep for your procedure, you may not have a normal bowel movement for a few days.  Please Note:  You might notice some irritation and congestion in your nose or some drainage.  This is from the oxygen used during your procedure.  There is no need for concern and it should clear up in a day or so.  SYMPTOMS TO REPORT IMMEDIATELY:   Following lower endoscopy (colonoscopy or flexible sigmoidoscopy):  Excessive amounts of blood in the stool  Significant tenderness or worsening of abdominal pains  Swelling of the abdomen that is new, acute  Fever of 100F or higher   Following upper endoscopy (EGD)  Vomiting of blood or coffee ground material  New chest pain or pain under the shoulder blades  Painful or persistently difficult swallowing  New shortness of breath  Fever of 100F or higher  Black, tarry-looking  stools  For urgent or emergent issues, a gastroenterologist can be reached at any hour by calling [phone].   DIET:  We do recommend a small meal at first, but then you may proceed to your regular diet.  Drink plenty of fluids but you should avoid alcoholic beverages for 24 hours.  ACTIVITY:  You should plan to take it easy for the rest of today and you should NOT DRIVE or use heavy machinery until tomorrow (because of the sedation medicines used during the test).    FOLLOW UP: Our staff will call the number listed on your records the next business day following your procedure to check on you and address any questions or concerns that you may have regarding the information given to you following your procedure. If we do not reach you, we will leave a message.  However, if you are feeling well and you are not experiencing any problems, there is no need to return our call.  We will assume that you have returned to your regular daily activities without incident.  If any biopsies were taken you will be contacted by phone or by letter within the next 1-3 weeks.  Please call us at [phone] if you have not heard about the biopsies in 3 weeks.    SIGNATURES/CONFIDENTIALITY: You and/or your care partner have signed paperwork which will be entered into your electronic medical record.  These signatures attest to the fact that that the information above on your After Visit Summary  has been reviewed and is understood.  Full responsibility of the confidentiality of this discharge information lies with you and/or your care-partner. 

## 2018-08-18 NOTE — Progress Notes (Signed)
Pt's states no medical or surgical changes since previsit or office visit. 

## 2018-08-19 ENCOUNTER — Telehealth: Payer: Self-pay | Admitting: *Deleted

## 2018-08-19 NOTE — Telephone Encounter (Signed)
  Follow up Call-  Call back number 08/18/2018  Post procedure Call Back phone  # [phone]  Permission to leave phone message Yes  Some recent data might be hidden     Patient questions:  Do you have a fever, pain , or abdominal swelling? No. Pain Score  0 *  Have you tolerated food without any problems? Yes.    Have you been able to return to your normal activities? Yes.    Do you have any questions about your discharge instructions: Diet   No. Medications  No. Follow up visit  No.  Do you have questions or concerns about your Care? No.  Actions: * If pain score is 4 or above: No action needed, pain <4.

## 2018-08-19 NOTE — Telephone Encounter (Signed)
`   Follow up Call-  Call back number 08/18/2018  Post procedure Call Back phone  # [phone]  Permission to leave phone message Yes  Some recent data might be hidden     Patient questions:  Message left to call us if necessary.

## 2018-08-20 NOTE — Op Note (Signed)
[place] Patient Name: Steven Blake Procedure Date: 08/18/2018 3:23 PM MRN: [id number] Endoscopist: Thornton Park MD, MD Age: 52 Referring MD:  Date of Birth: October 02, 1965 Gender: Male Account #: [id number] Procedure:                Colonoscopy Indications:              Screening for colorectal malignant neoplasm, This                            is the patient's first colonoscopy. There is no                            known family history of colon cancer or polyps. He                            reports intermittent bright red blood per rectum                            for several years. Medicines:                See the Anesthesia note for documentation of the                            administered medications Procedure:                Pre-Anesthesia Assessment:                           - Prior to the procedure, a History and Physical                            was performed, and patient medications and                            allergies were reviewed. The patient's tolerance of                            previous anesthesia was also reviewed. The risks                            and benefits of the procedure and the sedation                            options and risks were discussed with the patient.                            All questions were answered, and informed consent                            was obtained. Prior Anticoagulants: The patient has                            taken no previous anticoagulant or antiplatelet  agents. ASA Grade Assessment: II - A patient with                            mild systemic disease. After reviewing the risks                            and benefits, the patient was deemed in                            satisfactory condition to undergo the procedure.                           After obtaining informed consent, the colonoscope                            was passed under direct vision. Throughout  the                            procedure, the patient's blood pressure, pulse, and                            oxygen saturations were monitored continuously. The                            Colonoscope was introduced through the anus and                            advanced to the the terminal ileum, with                            identification of the appendiceal orifice and IC                            valve. The colonoscopy was performed without                            difficulty. The patient tolerated the procedure                            well. The quality of the bowel preparation was good. Scope In: 3:37:35 PM Scope Out: 3:50:34 PM Scope Withdrawal Time: 0 hours 9 minutes 56 seconds  Total Procedure Duration: 0 hours 12 minutes 59 seconds  Findings:                 The perianal and digital rectal examinations were                            normal.                           Non-bleeding non-thrombosed internal hemorrhoids                            were found during endoscopy. The hemorrhoids were  small.                           A 5 mm polyp was found in the sigmoid colon. The                            polyp was sessile. The polyp was removed with a                            cold snare. Resection and retrieval were complete.                            Estimated blood loss was minimal.                           A prominent appendiceal orifice was identified with                            what appears to be a possible submucosal process.                            The overlying mucosa appears normal. No bleeding                            was present. This was biopsied with a cold forceps                            for histology. I attempted to obtain tunneled                            biopsies.                           The exam was otherwise without abnormality on                            direct and retroflexion views. Complications:             No immediate complications. Estimated Blood Loss:     Estimated blood loss: none. Estimated blood loss                            was minimal. Impression:               - Non-bleeding non-thrombosed internal hemorrhoids                            are the likely cause of intermittent rectal                            bleeding.                           - One 5 mm polyp in the sigmoid colon, removed with  a cold snare. Resected and retrieved.                           - The examination was otherwise normal on direct                            and retroflexion views. Recommendation:           - Discharge patient to home.                           - Resume regular diet today. High-fiber diet                            recommended.                           - Continue present medications.                           - Await pathology results.                           - Repeat colonoscopy in 5 years for surveillance if                            the polyp is adenomatous. If the polyp is benign                            continue with routine colorectal cancer screening.                            Current guidelines suggest follow-up colonoscopy in                            10 years or earlier with the onset of any new                            symptoms.                           - Return to GI office in 2 weeks.                           - Plan CT abd/pelvis to follow-up on the                            appendiceal orifice if biopsies are non-diagnostic. Thornton Park MD, MD 08/18/2018 4:07:03 PM This report has been signed electronically.

## 2018-08-20 NOTE — Op Note (Signed)
[facility] Patient Name: Steven Blake Procedure Date: 08/18/2018 3:24 PM MRN: [id number] Endoscopist: Thornton Park MD, MD Age: 52 Referring MD:  Date of Birth: [birth year] Gender: Male Account #: [id number] Procedure:                Upper GI endoscopy Indications:              Epigastric abdominal pain, Suspected                            gastro-esophageal reflux disease, Abdominal bloating Medicines:                See the Anesthesia note for documentation of the                            administered medications Procedure:                Pre-Anesthesia Assessment:                           - Prior to the procedure, a History and Physical                            was performed, and patient medications and                            allergies were reviewed. The patient's tolerance of                            previous anesthesia was also reviewed. The risks                            and benefits of the procedure and the sedation                            options and risks were discussed with the patient.                            All questions were answered, and informed consent                            was obtained. Prior Anticoagulants: The patient has                            taken no previous anticoagulant or antiplatelet                            agents. ASA Grade Assessment: II - A patient with                            mild systemic disease. After reviewing the risks                            and benefits, the patient was deemed in  satisfactory condition to undergo the procedure.                           After obtaining informed consent, the endoscope was                            passed under direct vision. Throughout the                            procedure, the patient's blood pressure, pulse, and                            oxygen saturations were monitored continuously. The                            Endoscope was  introduced through the mouth, and                            advanced to the third part of duodenum. The upper                            GI endoscopy was accomplished without difficulty.                            The patient tolerated the procedure well. Scope In: Scope Out: Findings:                 LA Grade A (one or more mucosal breaks less than 5                            mm, not extending between tops of 2 mucosal folds)                            esophagitis with no bleeding was found. Biopsies                            were taken with a cold forceps for histology.                            Estimated blood loss was minimal.                           The entire examined stomach was normal. Biopsies                            were taken with a cold forceps for histology.                            Estimated blood loss: none.                           The examined duodenum was normal. Biopsies were  taken with a cold forceps for histology.                           The exam was otherwise without abnormality. Complications:            No immediate complications. Estimated Blood Loss:     Estimated blood loss: none. Impression:               - LA Grade A reflux esophagitis. Biopsied.                           - Normal stomach. Biopsied.                           - Normal examined duodenum. Biopsied.                           - The examination was otherwise normal. Recommendation:           - Await pathology results.                           - Continue omeprazole 40 mg daily.                           - Avoid all NSAIDs.                           - Proceed with colonoscopy today as previously                            planned. Thornton Park MD, MD 08/18/2018 3:58:28 PM This report has been signed electronically.

## 2018-08-25 ENCOUNTER — Other Ambulatory Visit: Payer: Self-pay

## 2018-08-25 DIAGNOSIS — R933 Abnormal findings on diagnostic imaging of other parts of digestive tract: Secondary | ICD-10-CM

## 2018-08-25 DIAGNOSIS — K625 Hemorrhage of anus and rectum: Secondary | ICD-10-CM

## 2018-09-01 ENCOUNTER — Ambulatory Visit: Payer: Non-veteran care | Admitting: Gastroenterology

## 2018-09-04 ENCOUNTER — Ambulatory Visit: Payer: Non-veteran care | Admitting: Gastroenterology

## 2018-09-15 ENCOUNTER — Ambulatory Visit (INDEPENDENT_AMBULATORY_CARE_PROVIDER_SITE_OTHER)
Admission: RE | Admit: 2018-09-15 | Discharge: 2018-09-15 | Disposition: A | Discharge status: Home / Self Care | Payer: No Typology Code available for payment source | Source: Ambulatory Visit | Attending: Gastroenterology | Admitting: Gastroenterology

## 2018-09-15 DIAGNOSIS — R933 Abnormal findings on diagnostic imaging of other parts of digestive tract: Secondary | ICD-10-CM

## 2018-09-15 DIAGNOSIS — K625 Hemorrhage of anus and rectum: Secondary | ICD-10-CM

## 2018-09-15 MED ORDER — IOPAMIDOL (ISOVUE-300) INJECTION 61%
100.0000 mL | Freq: Once | INTRAVENOUS | Status: AC | PRN
  Administered 2018-09-15: 100 mL via INTRAVENOUS

## 2018-09-17 ENCOUNTER — Telehealth: Payer: Self-pay | Admitting: Gastroenterology

## 2018-09-17 NOTE — Telephone Encounter (Signed)
Pt returned your call regarding ct scan results. Pls call him again.

## 2018-09-17 NOTE — Telephone Encounter (Signed)
Please see additional information on charting

## 2018-09-23 ENCOUNTER — Ambulatory Visit: Payer: Non-veteran care | Admitting: Gastroenterology

## 2018-10-05 ENCOUNTER — Encounter: Payer: Self-pay | Admitting: Gastroenterology

## 2018-10-05 ENCOUNTER — Ambulatory Visit (INDEPENDENT_AMBULATORY_CARE_PROVIDER_SITE_OTHER): Payer: No Typology Code available for payment source | Admitting: Gastroenterology

## 2018-10-05 VITALS — BP 148/84 | HR 78 | Ht 66.0 in | Wt 190.0 lb

## 2018-10-05 DIAGNOSIS — R935 Abnormal findings on diagnostic imaging of other abdominal regions, including retroperitoneum: Secondary | ICD-10-CM | POA: Diagnosis not present

## 2018-10-05 DIAGNOSIS — K639 Disease of intestine, unspecified: Secondary | ICD-10-CM

## 2018-10-05 DIAGNOSIS — R933 Abnormal findings on diagnostic imaging of other parts of digestive tract: Secondary | ICD-10-CM | POA: Diagnosis not present

## 2018-10-05 NOTE — Patient Instructions (Addendum)
If you are age 53 or older, your body mass index should be between 23-30. Your Body mass index is 30.67 kg/m. If this is out of the aforementioned range listed, please consider follow up with your Primary Care Provider.  If you are age 64 or younger, your body mass index should be between 19-25. Your Body mass index is 30.67 kg/m. If this is out of the aformentioned range listed, please consider follow up with your Primary Care Provider.   Please call with your decision to have CRS consult + Colonoscopy with EUS.   Thank you for choosing me and [facility].
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
## 2018-10-07 ENCOUNTER — Encounter: Payer: Self-pay | Admitting: Gastroenterology

## 2018-10-07 DIAGNOSIS — K639 Disease of intestine, unspecified: Secondary | ICD-10-CM | POA: Insufficient documentation

## 2018-10-07 DIAGNOSIS — R933 Abnormal findings on diagnostic imaging of other parts of digestive tract: Secondary | ICD-10-CM | POA: Insufficient documentation

## 2018-10-07 DIAGNOSIS — R935 Abnormal findings on diagnostic imaging of other abdominal regions, including retroperitoneum: Secondary | ICD-10-CM | POA: Insufficient documentation

## 2019-10-01 ENCOUNTER — Other Ambulatory Visit (HOSPITAL_COMMUNITY): Payer: Self-pay | Admitting: Nurse Practitioner

## 2019-10-25 IMAGING — CT CT ABD-PELV W/ CM
2 of 5 series · 15 of 46 positions shown, 17 images · IV contrast (ISOVUE 300)
Comparison: None.

CLINICAL DATA: Mid abdomen pain, abnormal colonoscopy, rectal
bleeding

EXAM:
CT ABDOMEN AND PELVIS WITH CONTRAST
TECHNIQUE: Multidetector CT imaging of the abdomen and pelvis was performed
using the standard protocol following bolus administration of
intravenous contrast.
CONTRAST:  100mL K40LDP-XVV IOPAMIDOL (K40LDP-XVV) INJECTION 61%

[Series 2: abd/pel w · axial · 0.72mm/px · z∈[+927,+1352]mm · 12 of 97 slices shown, 14 images]
[im 6/97  soft-tissue]
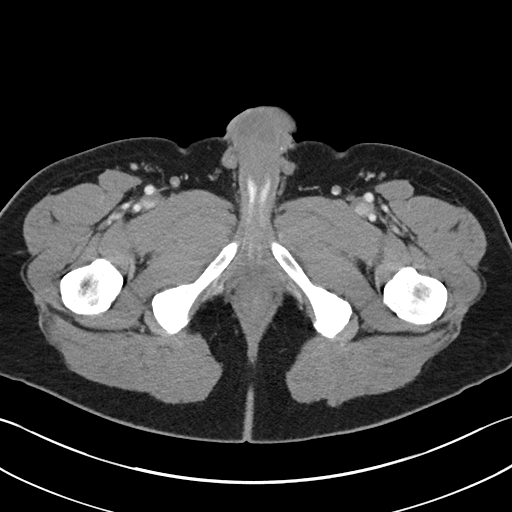
[im 6/97  bone]
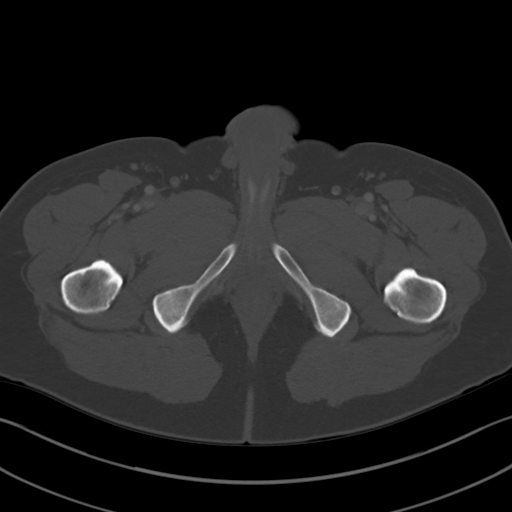
[im 16/97  soft-tissue]
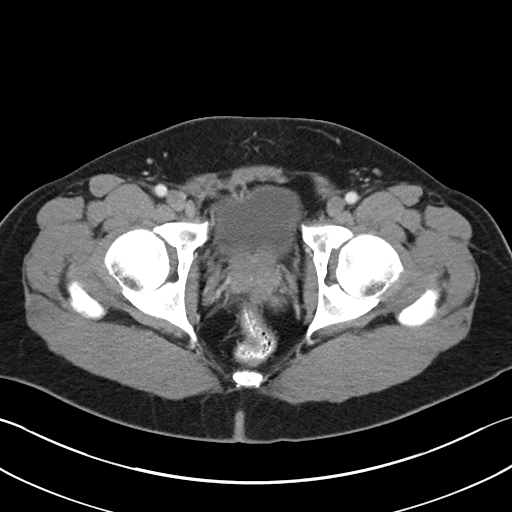
[im 21/97  soft-tissue]
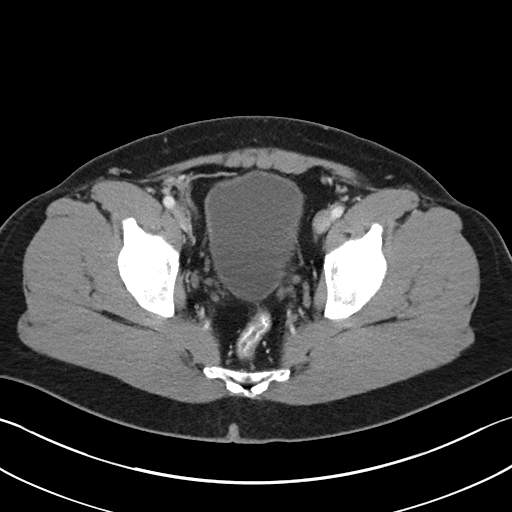
[im 31/97  soft-tissue]
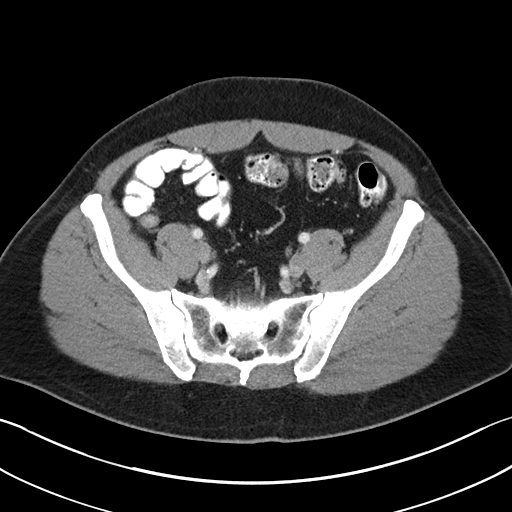
[im 36/97  soft-tissue]
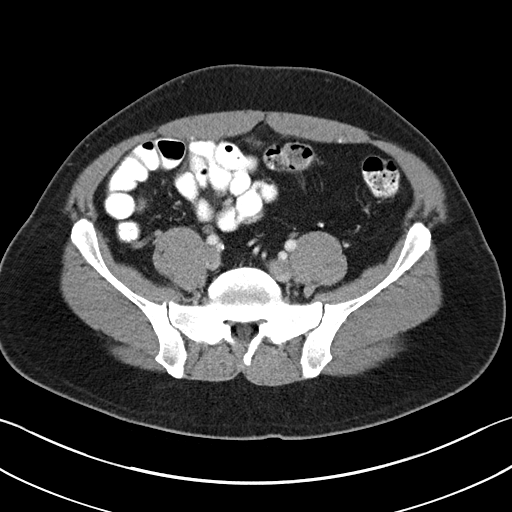
[im 46/97  soft-tissue]
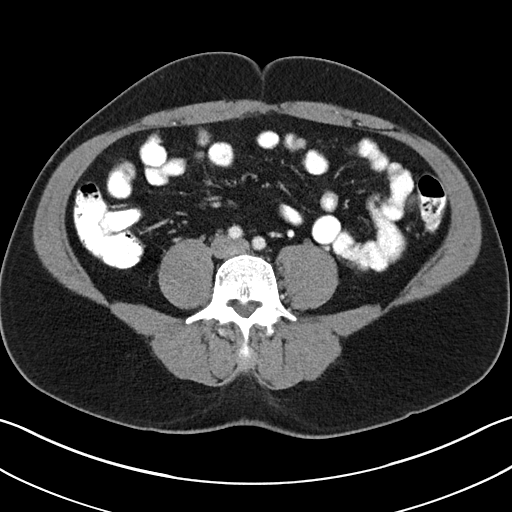
[im 51/97  soft-tissue]
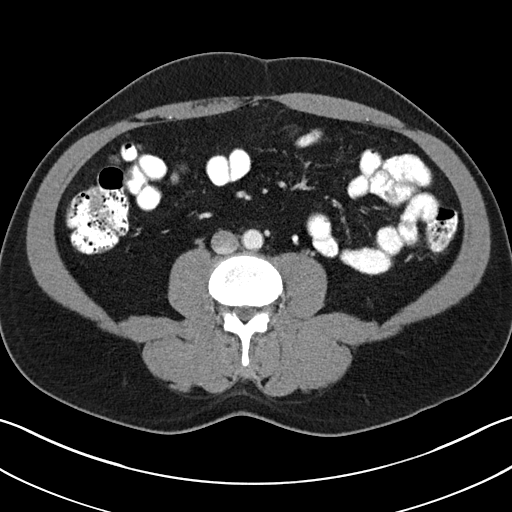
[im 61/97  soft-tissue]
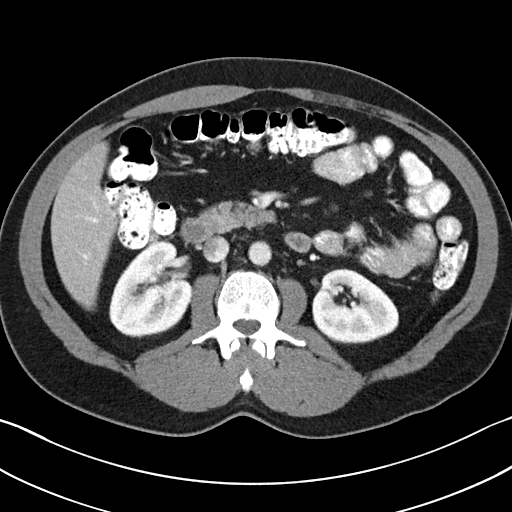
[im 66/97  soft-tissue]
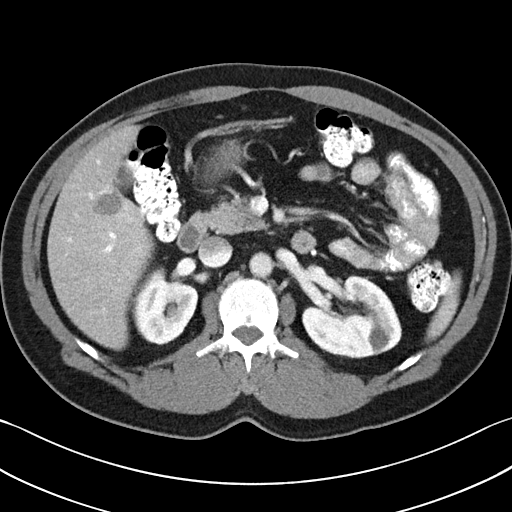
[im 66/97  bone]
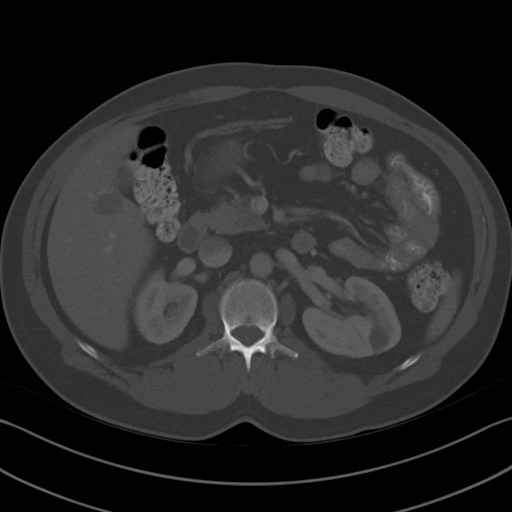
[im 76/97  soft-tissue]
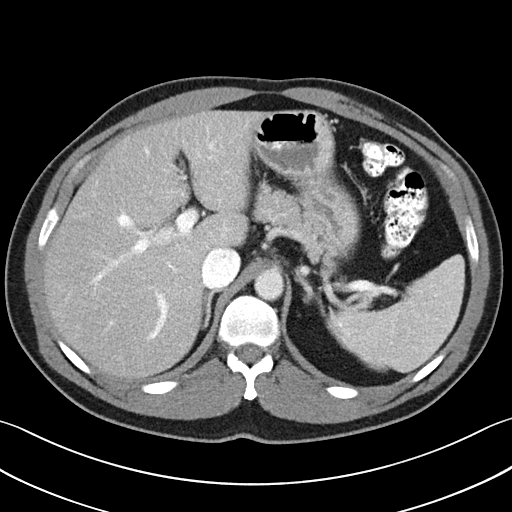
[im 81/97  soft-tissue]
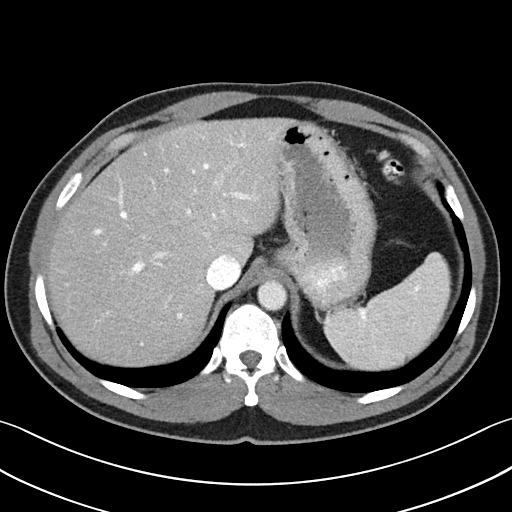
[im 91/97  soft-tissue]
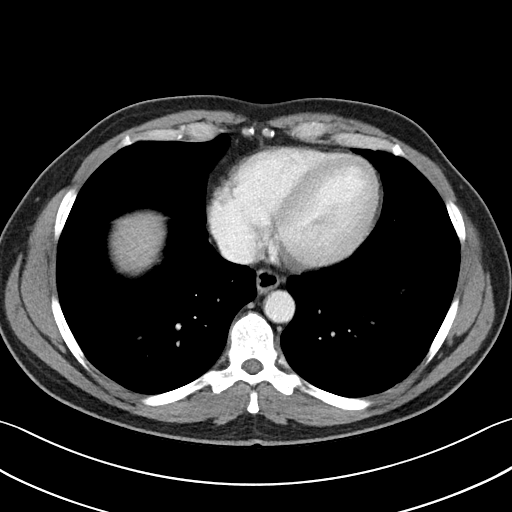

[Series 5: abd/pel w st · coronal · 0.70mm/px · 3 of 87 slices shown]
[im 29/87  soft-tissue]
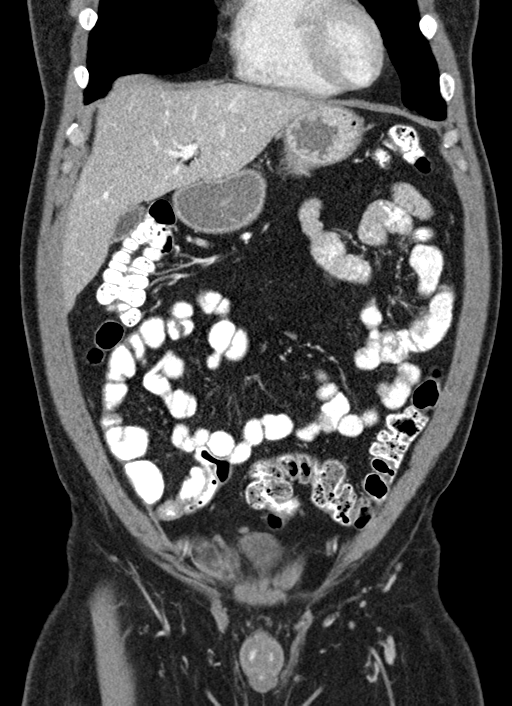
[im 39/87  soft-tissue]
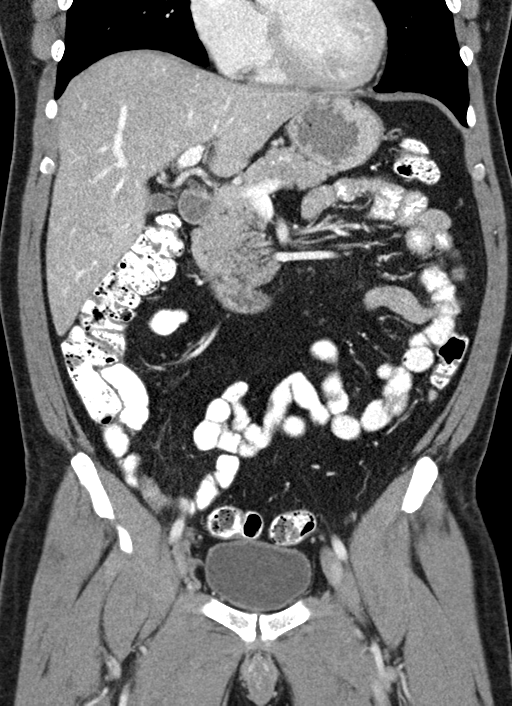
[im 48/87  soft-tissue]
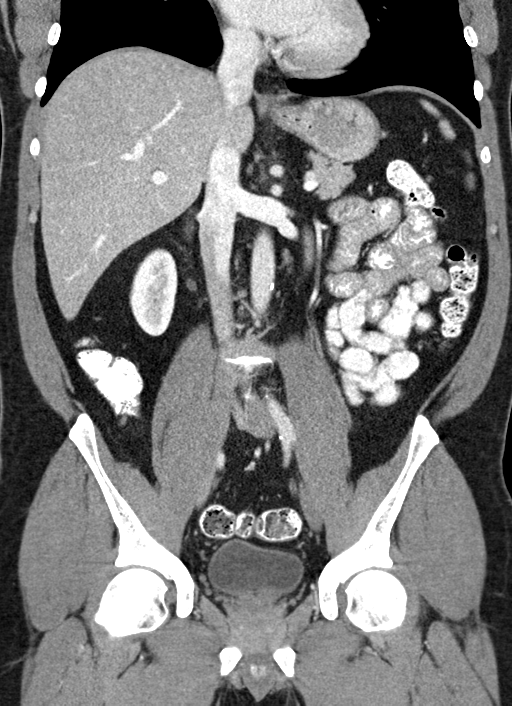

[15 of 46 positions shown; findings below may reference images not displayed]

FINDINGS: Lower chest: The lung bases are clear. The heart is within upper
limits of normal. No pericardial effusion is seen.

Hepatobiliary: The liver enhances with no focal abnormality and no
ductal dilatation is seen. An irregularly marginated low-attenuation
area near the gallbladder measures 20 mm in diameter. On delayed
images this demonstrates peripheral enhancement, and is most
consistent with hemangioma. No other hepatic abnormality is seen. No
calcified gallstones are noted.

Pancreas: The pancreas is normal in size and the pancreatic duct is
not dilated. The peripancreatic fat planes appear well preserved.

Spleen: The spleen is unremarkable other than a probable splenic
cyst 2.1 cm in diameter.

Adrenals/Urinary Tract: The adrenal glands appear normal. The
kidneys enhance and there is a cyst within the posterior upper left
kidney which appears simple. No renal calculi are noted. On delayed
images, the pelvocaliceal systems are unremarkable and the ureters
appear normal in caliber. The urinary bladder is not well distended
but no abnormality is seen.

Stomach/Bowel: The stomach is not well distended but there may be a
small hiatal hernia present. No small bowel wall thickening is seen
and no distention of small bowel is evident. There is feces
throughout the colon but no definite colonic lesion is seen. The
terminal ileum and ileocecal valve are unremarkable. The appendix
appears normal. The colonoscopy raised a question of possible small
mucosal abnormality at the orifice of the appendix. In review of the
multiplanar images, the particularly the sagittal image, there does
appear to be a 9 mm soft tissue structure very near the orifice of
the appendix, of uncertain significance on this study.

Vascular/Lymphatic: The abdominal aorta is normal in caliber with
mild abdominal aortic atherosclerosis. No adenopathy is seen.

Reproductive: The prostate gland is normal in size although somewhat
inhomogeneous in attenuation. No adjacent adenopathy is seen.

Other: No abdominal wall hernia is seen.

Musculoskeletal: The lumbar vertebrae are in normal alignment with
normal intervertebral disc spaces. The SI joints appear corticated.
IMPRESSION: 1. There is a small 9 mm soft tissue structure very near the orifice
of the appendix, and a mucosal lesion can not be excluded. Correlate
with colonoscopy results.
2. Probable 2 cm hepatic hemangioma near the gallbladder as noted
above.
3. Also question 2.1 cm splenic cyst of doubtful significance.
